# Patient Record
Sex: Female | Born: 1975 | Race: White | Hispanic: No | Marital: Married | Smoking: Never smoker
Health system: Southern US, Community
[De-identification: ages and names within clinical notes are randomized; demographics above are authoritative.]

## PROBLEM LIST (undated history)

## (undated) DIAGNOSIS — I10 Essential (primary) hypertension: Secondary | ICD-10-CM

## (undated) HISTORY — PX: NO PAST SURGERIES: SHX2092

---

## 2010-05-27 ENCOUNTER — Inpatient Hospital Stay (HOSPITAL_COMMUNITY): Admission: AD | Admit: 2010-05-27 | Discharge: 2010-05-29 | Payer: Self-pay | Admitting: Obstetrics and Gynecology

## 2010-10-25 LAB — CBC
Hemoglobin: 10.4 g/dL — ABNORMAL LOW (ref 12.0–15.0)
MCH: 32.4 pg (ref 26.0–34.0)
MCH: 32.9 pg (ref 26.0–34.0)
MCHC: 34.5 g/dL (ref 30.0–36.0)
MCHC: 35.2 g/dL (ref 30.0–36.0)
MCV: 94.6 fL (ref 78.0–100.0)
Platelets: 139 10*3/uL — ABNORMAL LOW (ref 150–400)
RBC: 3.15 MIL/uL — ABNORMAL LOW (ref 3.87–5.11)
RDW: 13.1 % (ref 11.5–15.5)
WBC: 10.2 10*3/uL (ref 4.0–10.5)

## 2010-10-25 LAB — RPR: RPR Ser Ql: NONREACTIVE

## 2010-10-25 LAB — RH IMMUNE GLOB WKUP(>/=20WKS)(NOT WOMEN'S HOSP): Fetal Screen: NEGATIVE

## 2013-11-18 ENCOUNTER — Other Ambulatory Visit: Payer: Self-pay | Admitting: Obstetrics and Gynecology

## 2013-11-18 DIAGNOSIS — N6314 Unspecified lump in the right breast, lower inner quadrant: Secondary | ICD-10-CM

## 2013-11-26 ENCOUNTER — Other Ambulatory Visit: Payer: Self-pay

## 2013-12-09 ENCOUNTER — Ambulatory Visit
Admission: RE | Admit: 2013-12-09 | Discharge: 2013-12-09 | Disposition: A | Discharge status: Home / Self Care | Payer: BC Managed Care – PPO | Source: Ambulatory Visit | Attending: Obstetrics and Gynecology | Admitting: Obstetrics and Gynecology

## 2013-12-09 DIAGNOSIS — N6314 Unspecified lump in the right breast, lower inner quadrant: Secondary | ICD-10-CM

## 2014-01-26 LAB — OB RESULTS CONSOLE HIV ANTIBODY (ROUTINE TESTING): HIV: NONREACTIVE

## 2014-01-26 LAB — OB RESULTS CONSOLE GC/CHLAMYDIA
CHLAMYDIA, DNA PROBE: NEGATIVE
Gonorrhea: NEGATIVE

## 2014-01-26 LAB — OB RESULTS CONSOLE ANTIBODY SCREEN: ANTIBODY SCREEN: NEGATIVE

## 2014-01-26 LAB — OB RESULTS CONSOLE ABO/RH: RH TYPE: NEGATIVE

## 2014-01-26 LAB — OB RESULTS CONSOLE RUBELLA ANTIBODY, IGM: Rubella: IMMUNE

## 2014-01-26 LAB — OB RESULTS CONSOLE RPR: RPR: NONREACTIVE

## 2014-01-26 LAB — OB RESULTS CONSOLE HEPATITIS B SURFACE ANTIGEN: Hepatitis B Surface Ag: NEGATIVE

## 2014-08-12 ENCOUNTER — Inpatient Hospital Stay (HOSPITAL_COMMUNITY)
Admission: AD | Admit: 2014-08-12 | Discharge: 2014-08-12 | Disposition: A | Discharge status: Home / Self Care | Payer: BC Managed Care – PPO | Source: Ambulatory Visit | Attending: Obstetrics and Gynecology | Admitting: Obstetrics and Gynecology

## 2014-08-12 ENCOUNTER — Encounter (HOSPITAL_COMMUNITY): Payer: Self-pay | Admitting: *Deleted

## 2014-08-12 DIAGNOSIS — O133 Gestational [pregnancy-induced] hypertension without significant proteinuria, third trimester: Secondary | ICD-10-CM | POA: Diagnosis not present

## 2014-08-12 DIAGNOSIS — Z3A37 37 weeks gestation of pregnancy: Secondary | ICD-10-CM | POA: Diagnosis not present

## 2014-08-12 DIAGNOSIS — R030 Elevated blood-pressure reading, without diagnosis of hypertension: Secondary | ICD-10-CM | POA: Diagnosis present

## 2014-08-12 LAB — URINE MICROSCOPIC-ADD ON

## 2014-08-12 LAB — URINALYSIS, ROUTINE W REFLEX MICROSCOPIC
Bilirubin Urine: NEGATIVE
GLUCOSE, UA: NEGATIVE mg/dL
Ketones, ur: NEGATIVE mg/dL
Leukocytes, UA: NEGATIVE
Nitrite: NEGATIVE
PH: 6.5 (ref 5.0–8.0)
Protein, ur: NEGATIVE mg/dL
Urobilinogen, UA: 0.2 mg/dL (ref 0.0–1.0)

## 2014-08-12 LAB — CBC
HCT: 33.5 % — ABNORMAL LOW (ref 36.0–46.0)
Hemoglobin: 12 g/dL (ref 12.0–15.0)
MCH: 32.5 pg (ref 26.0–34.0)
MCHC: 35.8 g/dL (ref 30.0–36.0)
MCV: 90.8 fL (ref 78.0–100.0)
PLATELETS: 121 10*3/uL — AB (ref 150–400)
RBC: 3.69 MIL/uL — AB (ref 3.87–5.11)
RDW: 13.4 % (ref 11.5–15.5)
WBC: 9.8 10*3/uL (ref 4.0–10.5)

## 2014-08-12 LAB — COMPREHENSIVE METABOLIC PANEL
ALT: 17 U/L (ref 0–35)
AST: 29 U/L (ref 0–37)
Albumin: 3.1 g/dL — ABNORMAL LOW (ref 3.5–5.2)
Alkaline Phosphatase: 120 U/L — ABNORMAL HIGH (ref 39–117)
Anion gap: 6 (ref 5–15)
BUN: 13 mg/dL (ref 6–23)
CALCIUM: 9.8 mg/dL (ref 8.4–10.5)
CO2: 27 mmol/L (ref 19–32)
Chloride: 105 mEq/L (ref 96–112)
Creatinine, Ser: 0.88 mg/dL (ref 0.50–1.10)
GFR calc Af Amer: >90 mL/min (ref >90)
GFR calc non Af Amer: 84 mL/min — ABNORMAL LOW (ref >90)
Glucose, Bld: 101 mg/dL — ABNORMAL HIGH (ref 70–99)
POTASSIUM: 4.2 mmol/L (ref 3.5–5.1)
SODIUM: 138 mmol/L (ref 135–145)
TOTAL PROTEIN: 5.7 g/dL — AB (ref 6.0–8.3)
Total Bilirubin: 0.5 mg/dL (ref 0.3–1.2)

## 2014-08-12 LAB — PROTEIN / CREATININE RATIO, URINE: CREATININE, URINE: 43 mg/dL

## 2014-08-12 NOTE — Discharge Instructions (Signed)

## 2014-08-12 NOTE — MAU Note (Signed)
Sent from OB's office for PIH eval;  

## 2014-08-12 NOTE — MAU Provider Note (Signed)
History     CSN: [id number]  Arrival date and time: 08/12/14 1534   First Provider Initiated Contact with Patient 08/12/14 1707      Chief Complaint  Patient presents with  . Hypertension   HPI  Latasha Robertson is a 38 y.o. G2P1000 at 4372w0d who was sent over from the office with elevated blood pressure. She denies any HA, visual disturbance, RUQ pain or edema. She states that the fetus has been active, and she denies any contractions, VB or LOF.   Past Medical History  Diagnosis Date  . Medical history non-contributory     Past Surgical History  Procedure Laterality Date  . No past surgeries      Family History  Problem Relation Age of Onset  . Alcohol abuse Neg Hx   . Asthma Neg Hx   . Arthritis Neg Hx   . Birth defects Neg Hx   . Cancer Neg Hx   . COPD Neg Hx   . Depression Neg Hx   . Diabetes Neg Hx   . Drug abuse Neg Hx   . Early death Neg Hx   . Hearing loss Neg Hx   . Heart disease Neg Hx   . Hyperlipidemia Neg Hx   . Hypertension Neg Hx   . Kidney disease Neg Hx   . Learning disabilities Neg Hx   . Mental illness Neg Hx   . Mental retardation Neg Hx   . Miscarriages / Stillbirths Neg Hx   . Stroke Neg Hx   . Vision loss Neg Hx   . Varicose Veins Neg Hx     History  Substance Use Topics  . Smoking status: Never Smoker   . Smokeless tobacco: Not on file  . Alcohol Use: No    Allergies:  Allergies  Allergen Reactions  . Tramadol Nausea And Vomiting  . Vicodin [Hydrocodone-Acetaminophen] Nausea And Vomiting    Prescriptions prior to admission  Medication Sig Dispense Refill Last Dose  . Prenatal Vit-Fe Fumarate-FA (PRENATAL MULTIVITAMIN) TABS tablet Take 1 tablet by mouth daily at 12 noon.   08/12/2014 at Unknown time    ROS Physical Exam   Blood pressure 127/86, pulse 77, temperature 99.3 F (37.4 C), temperature source Oral, resp. rate 18, height 5\' 4"  (1.626 m), weight 73.936 kg (163 lb).  Physical Exam  Nursing note and  vitals reviewed. Constitutional: She is oriented to person, place, and time. She appears well-developed and well-nourished. No distress.  Cardiovascular: Normal rate.   Respiratory: Effort normal.  GI: Soft. There is no tenderness.  Musculoskeletal: She exhibits no edema.  Neurological: She is alert and oriented to person, place, and time. She has normal reflexes.  No clonus   Skin: Skin is dry.  Psychiatric: She has a normal mood and affect.    MAU Course  Procedures  Results for orders placed or performed during the hospital encounter of 08/12/14 (from the past 24 hour(s))  CBC     Status: Abnormal   Collection Time: 08/12/14  4:00 PM  Result Value Ref Range   WBC 9.8 4.0 - 10.5 K/uL   RBC 3.69 (L) 3.87 - 5.11 MIL/uL   Hemoglobin 12.0 12.0 - 15.0 g/dL   HCT 78.233.5 (L) 95.636.0 - 21.346.0 %   MCV 90.8 78.0 - 100.0 fL   MCH 32.5 26.0 - 34.0 pg   MCHC 35.8 30.0 - 36.0 g/dL   RDW 08.613.4 57.811.5 - 46.915.5 %   Platelets 121 (L) 150 - 400  K/uL  Comprehensive metabolic panel     Status: Abnormal   Collection Time: 08/12/14  4:00 PM  Result Value Ref Range   Sodium 138 135 - 145 mmol/L   Potassium 4.2 3.5 - 5.1 mmol/L   Chloride 105 96 - 112 mEq/L   CO2 27 19 - 32 mmol/L   Glucose, Bld 101 (H) 70 - 99 mg/dL   BUN 13 6 - 23 mg/dL   Creatinine, Ser 1.610.88 0.50 - 1.10 mg/dL   Calcium 9.8 8.4 - 09.610.5 mg/dL   Total Protein 5.7 (L) 6.0 - 8.3 g/dL   Albumin 3.1 (L) 3.5 - 5.2 g/dL   AST 29 0 - 37 U/L   ALT 17 0 - 35 U/L   Alkaline Phosphatase 120 (H) 39 - 117 U/L   Total Bilirubin 0.5 0.3 - 1.2 mg/dL   GFR calc non Af Amer 84 (L) >90 mL/min   GFR calc Af Amer >90 >90 mL/min   Anion gap 6 5 - 15  Urinalysis, Routine w reflex microscopic     Status: Abnormal   Collection Time: 08/12/14  4:17 PM  Result Value Ref Range   Color, Urine STRAW (A) YELLOW   APPearance CLEAR CLEAR   Specific Gravity, Urine <1.005 (L) 1.005 - 1.030   pH 6.5 5.0 - 8.0   Glucose, UA NEGATIVE NEGATIVE mg/dL   Hgb urine  dipstick MODERATE (A) NEGATIVE   Bilirubin Urine NEGATIVE NEGATIVE   Ketones, ur NEGATIVE NEGATIVE mg/dL   Protein, ur NEGATIVE NEGATIVE mg/dL   Urobilinogen, UA 0.2 0.0 - 1.0 mg/dL   Nitrite NEGATIVE NEGATIVE   Leukocytes, UA NEGATIVE NEGATIVE  Protein / creatinine ratio, urine     Status: None   Collection Time: 08/12/14  4:17 PM  Result Value Ref Range   Creatinine, Urine 43.00 mg/dL   Total Protein, Urine <6 mg/dL   Protein Creatinine Ratio        0.00 - 0.15  Urine microscopic-add on     Status: Abnormal   Collection Time: 08/12/14  4:17 PM  Result Value Ref Range   Squamous Epithelial / LPF FEW (A) RARE   WBC, UA 0-2 <3 WBC/hpf   RBC / HPF 3-6 <3 RBC/hpf   Bacteria, UA MANY (A) RARE   Urine-Other AMORPHOUS URATES/PHOSPHATES    1840: D/W Dr. Henderson Cloudomblin, will dc home FU with the office on Monday/Tuesday.  Assessment and Plan   1. Transient hypertension of pregnancy in third trimester    Pre-eclampsia warning signs  Return to MAU as needed FU with the office Monday or Tuesday  Follow-up Information    [facility] HemII,JAMES E, MD.   Specialty:  Obstetrics and Gynecology   Why:  Call for an appoinment for Monday or Tuesday    Contact information:   [address] [place] [address] [phone]        Tawnya CrookHogan, Heather Donovan 08/12/2014, 5:18 PM

## 2014-08-16 ENCOUNTER — Encounter (HOSPITAL_COMMUNITY): Payer: Self-pay | Admitting: *Deleted

## 2014-08-16 ENCOUNTER — Inpatient Hospital Stay (HOSPITAL_COMMUNITY)
Admission: AD | Admit: 2014-08-16 | Discharge: 2014-08-18 | DRG: 775 | Disposition: A | Discharge status: Home / Self Care | Payer: BLUE CROSS/BLUE SHIELD | Source: Ambulatory Visit | Attending: Obstetrics and Gynecology | Admitting: Obstetrics and Gynecology

## 2014-08-16 DIAGNOSIS — O133 Gestational [pregnancy-induced] hypertension without significant proteinuria, third trimester: Principal | ICD-10-CM | POA: Diagnosis present

## 2014-08-16 DIAGNOSIS — O09523 Supervision of elderly multigravida, third trimester: Secondary | ICD-10-CM | POA: Diagnosis not present

## 2014-08-16 DIAGNOSIS — O360930 Maternal care for other rhesus isoimmunization, third trimester, not applicable or unspecified: Secondary | ICD-10-CM | POA: Diagnosis present

## 2014-08-16 DIAGNOSIS — Z3A37 37 weeks gestation of pregnancy: Secondary | ICD-10-CM | POA: Diagnosis present

## 2014-08-16 DIAGNOSIS — O99824 Streptococcus B carrier state complicating childbirth: Secondary | ICD-10-CM | POA: Diagnosis present

## 2014-08-16 DIAGNOSIS — O139 Gestational [pregnancy-induced] hypertension without significant proteinuria, unspecified trimester: Secondary | ICD-10-CM | POA: Diagnosis present

## 2014-08-16 HISTORY — DX: Essential (primary) hypertension: I10

## 2014-08-16 LAB — COMPREHENSIVE METABOLIC PANEL
ALBUMIN: 3.3 g/dL — AB (ref 3.5–5.2)
ALK PHOS: 127 U/L — AB (ref 39–117)
ALT: 18 U/L (ref 0–35)
AST: 37 U/L (ref 0–37)
Anion gap: 10 (ref 5–15)
BUN: 15 mg/dL (ref 6–23)
CO2: 23 mmol/L (ref 19–32)
Calcium: 9.1 mg/dL (ref 8.4–10.5)
Chloride: 106 mEq/L (ref 96–112)
Creatinine, Ser: 0.95 mg/dL (ref 0.50–1.10)
GFR calc Af Amer: 88 mL/min — ABNORMAL LOW (ref >90)
GFR, EST NON AFRICAN AMERICAN: 76 mL/min — AB (ref >90)
GLUCOSE: 72 mg/dL (ref 70–99)
POTASSIUM: 3.9 mmol/L (ref 3.5–5.1)
Sodium: 139 mmol/L (ref 135–145)
Total Bilirubin: 0.8 mg/dL (ref 0.3–1.2)
Total Protein: 5.8 g/dL — ABNORMAL LOW (ref 6.0–8.3)

## 2014-08-16 LAB — CBC
HEMATOCRIT: 34.9 % — AB (ref 36.0–46.0)
HEMOGLOBIN: 12.3 g/dL (ref 12.0–15.0)
MCH: 32.2 pg (ref 26.0–34.0)
MCHC: 35.2 g/dL (ref 30.0–36.0)
MCV: 91.4 fL (ref 78.0–100.0)
Platelets: 140 10*3/uL — ABNORMAL LOW (ref 150–400)
RBC: 3.82 MIL/uL — AB (ref 3.87–5.11)
RDW: 13.6 % (ref 11.5–15.5)
WBC: 9.1 10*3/uL (ref 4.0–10.5)

## 2014-08-16 LAB — LACTATE DEHYDROGENASE: LDH: 212 U/L (ref 94–250)

## 2014-08-16 LAB — URIC ACID: Uric Acid, Serum: 5.3 mg/dL (ref 2.4–7.0)

## 2014-08-16 LAB — OB RESULTS CONSOLE GBS: GBS: POSITIVE

## 2014-08-16 MED ORDER — OXYTOCIN 40 UNITS IN LACTATED RINGERS INFUSION - SIMPLE MED: 62.5000 mL/h | INTRAVENOUS | Status: DC

## 2014-08-16 MED ORDER — OXYTOCIN BOLUS FROM INFUSION: 500.0000 mL | INTRAVENOUS | Status: DC

## 2014-08-16 MED ORDER — PENICILLIN G POTASSIUM 5000000 UNITS IJ SOLR
2.5000 10*6.[IU] | INTRAVENOUS | Status: DC
  Filled 2014-08-16 (×2): qty 2.5

## 2014-08-16 MED ORDER — MISOPROSTOL 25 MCG QUARTER TABLET
25.0000 ug | ORAL_TABLET | ORAL | Status: DC | PRN
  Administered 2014-08-16 – 2014-08-17 (×2): 25 ug via VAGINAL
  Filled 2014-08-16 (×2): qty 0.25
  Filled 2014-08-16: qty 1

## 2014-08-16 MED ORDER — LIDOCAINE HCL (PF) 1 % IJ SOLN
30.0000 mL | INTRAMUSCULAR | Status: DC | PRN
  Administered 2014-08-17: 30 mL via SUBCUTANEOUS
  Filled 2014-08-16: qty 30

## 2014-08-16 MED ORDER — ONDANSETRON HCL 4 MG/2ML IJ SOLN: 4.0000 mg | Freq: Four times a day (QID) | INTRAMUSCULAR | Status: DC | PRN

## 2014-08-16 MED ORDER — CITRIC ACID-SODIUM CITRATE 334-500 MG/5ML PO SOLN: 30.0000 mL | ORAL | Status: DC | PRN

## 2014-08-16 MED ORDER — LACTATED RINGERS IV SOLN: 500.0000 mL | INTRAVENOUS | Status: DC | PRN

## 2014-08-16 MED ORDER — TERBUTALINE SULFATE 1 MG/ML IJ SOLN: 0.2500 mg | Freq: Once | INTRAMUSCULAR | Status: AC | PRN

## 2014-08-16 MED ORDER — PENICILLIN G POTASSIUM 5000000 UNITS IJ SOLR
2.5000 10*6.[IU] | INTRAVENOUS | Status: DC
  Administered 2014-08-17: 2.5 10*6.[IU] via INTRAVENOUS
  Filled 2014-08-16 (×5): qty 2.5

## 2014-08-16 MED ORDER — PENICILLIN G POTASSIUM 5000000 UNITS IJ SOLR
5.0000 10*6.[IU] | Freq: Once | INTRAVENOUS | Status: AC
  Administered 2014-08-17: 5 10*6.[IU] via INTRAVENOUS
  Filled 2014-08-16: qty 5

## 2014-08-16 MED ORDER — FLEET ENEMA 7-19 GM/118ML RE ENEM: 1.0000 | ENEMA | RECTAL | Status: DC | PRN

## 2014-08-16 MED ORDER — ACETAMINOPHEN 325 MG PO TABS: 650.0000 mg | ORAL_TABLET | ORAL | Status: DC | PRN

## 2014-08-16 MED ORDER — ZOLPIDEM TARTRATE 5 MG PO TABS: 5.0000 mg | ORAL_TABLET | Freq: Every evening | ORAL | Status: DC | PRN

## 2014-08-16 MED ORDER — LACTATED RINGERS IV SOLN
INTRAVENOUS | Status: DC
  Administered 2014-08-16 – 2014-08-17 (×2): via INTRAVENOUS

## 2014-08-16 MED ORDER — PENICILLIN G POTASSIUM 5000000 UNITS IJ SOLR
5.0000 10*6.[IU] | Freq: Once | INTRAVENOUS | Status: DC
  Filled 2014-08-16: qty 5

## 2014-08-16 NOTE — Anesthesia Preprocedure Evaluation (Addendum)
Anesthesia Evaluation  Patient identified by MRN, date of birth, ID band Patient awake    Reviewed: Allergy & Precautions, H&P , NPO status , Patient's Chart, lab work & pertinent test results  History of Anesthesia Complications Negative for: history of anesthetic complications  Airway Mallampati: II  TM Distance: >3 FB Neck ROM: Full    Dental no notable dental hx. (+) Dental Advisory Given   Pulmonary neg pulmonary ROS,  breath sounds clear to auscultation  Pulmonary exam normal       Cardiovascular hypertension (admitted for IOL for gestational HTN), Rhythm:Regular Rate:Normal     Neuro/Psych negative neurological ROS  negative psych ROS   GI/Hepatic negative GI ROS, Neg liver ROS,   Endo/Other  negative endocrine ROS  Renal/GU negative Renal ROS  negative genitourinary   Musculoskeletal negative musculoskeletal ROS (+)   Abdominal   Peds negative pediatric ROS (+)  Hematology negative hematology ROS (+)   Anesthesia Other Findings   Significant drop in Plts; recheck ordered postpartum     Reproductive/Obstetrics (+) Pregnancy                            Anesthesia Physical Anesthesia Plan  ASA: II  Anesthesia Plan: Epidural   Post-op Pain Management:    Induction:   Airway Management Planned:   Additional Equipment:   Intra-op Plan:   Post-operative Plan:   Informed Consent: I have reviewed the patients History and Physical, chart, labs and discussed the procedure including the risks, benefits and alternatives for the proposed anesthesia with the patient or authorized representative who has indicated his/her understanding and acceptance.   Dental Advisory Given  Plan Discussed with:   Anesthesia Plan Comments: (Labs checked- platelets confirmed with RN in room. Fetal heart tracing, per RN, reported to be stable enough for sitting procedure. Discussed epidural, and  patient consents to the procedure:  included risk of possible headache,backache, failed block, allergic reaction, and nerve injury. This patient was asked if she had any questions or concerns before the procedure started.)       Anesthesia Quick Evaluation

## 2014-08-16 NOTE — H&P (Signed)
Latasha Robertson is a 39 y.o. female presenting for induction of labor secondary to gestational hypertension.  The patient has been followed in the office over the past week for elevated BPs (mild range).  She denies HA, CP/SOB, RUQ pain, or vision change.  Antepartum course has been complicated by Rh neg, AMA with negative first tri screen, and back pain well controlled without medication.  GBS positive.  Maternal Medical History:  Fetal activity: Perceived fetal activity is normal.   Last perceived fetal movement was within the past hour.    Prenatal complications: PIH.   Prenatal Complications - Diabetes: none.    OB History    Gravida Para Term Preterm AB TAB SAB Ectopic Multiple Living   Past Medical History  Diagnosis Date  . Medical history non-contributory    Past Surgical History  Procedure Laterality Date  . No past surgeries     Family History: family history is negative for Alcohol abuse, Asthma, Arthritis, Birth defects, Cancer, COPD, Depression, Diabetes, Drug abuse, Early death, Hearing loss, Heart disease, Hyperlipidemia, Hypertension, Kidney disease, Learning disabilities, Mental illness, Mental retardation, Miscarriages / Stillbirths, Stroke, Vision loss, and Varicose Veins. Social History:  reports that she has never smoked. She does not have any smokeless tobacco history on file. She reports that she does not drink alcohol or use illicit drugs.   Prenatal Transfer Tool  Maternal Diabetes: No Genetic Screening: Normal Maternal Ultrasounds/Referrals: Normal Fetal Ultrasounds or other Referrals:  None Maternal Substance Abuse:  No Significant Maternal Medications:  None Significant Maternal Lab Results:  Lab values include: Group B Strep positive Other Comments:  None  ROS    There were no vitals taken for this visit. Maternal Exam:  Uterine Assessment: Contraction strength is mild.  Contraction frequency is rare.   Abdomen: Patient  reports no abdominal tenderness. Fundal height is c/w dates.   Estimated fetal weight is 6#8.   Fetal presentation: vertex     Physical Exam  Constitutional: She is oriented to person, place, and time. She appears well-developed and well-nourished.  GI: Soft. There is no rebound and no guarding.  Neurological: She is alert and oriented to person, place, and time.  Skin: Skin is warm and dry.  Psychiatric: She has a normal mood and affect. Her behavior is normal.    Prenatal labs: ABO, Rh: B/Negative/-- (06/16 0000) Antibody: Negative (06/16 0000) Rubella: Immune (06/16 0000) RPR: Nonreactive (06/16 0000)  HBsAg: Negative (06/16 0000)  HIV: Non-reactive (06/16 0000)  GBS:     Assessment/Plan: 38yo G2P1001 at [redacted]w[redacted]d with GHTN -IOL with VMP -Epidural when desired -PCN for GBS ppx   [name] 08/16/2014, 7:53 PM

## 2014-08-17 ENCOUNTER — Inpatient Hospital Stay (HOSPITAL_COMMUNITY): Payer: BLUE CROSS/BLUE SHIELD | Admitting: Anesthesiology

## 2014-08-17 ENCOUNTER — Encounter (HOSPITAL_COMMUNITY): Payer: Self-pay | Admitting: *Deleted

## 2014-08-17 LAB — CBC
HCT: 34.8 % — ABNORMAL LOW (ref 36.0–46.0)
HCT: 32.8 % — ABNORMAL LOW (ref 36.0–46.0)
HEMOGLOBIN: 12.3 g/dL (ref 12.0–15.0)
Hemoglobin: 11.9 g/dL — ABNORMAL LOW (ref 12.0–15.0)
MCH: 32.1 pg (ref 26.0–34.0)
MCH: 32.9 pg (ref 26.0–34.0)
MCHC: 35.3 g/dL (ref 30.0–36.0)
MCHC: 36.3 g/dL — ABNORMAL HIGH (ref 30.0–36.0)
MCV: 90.9 fL (ref 78.0–100.0)
MCV: 90.6 fL (ref 78.0–100.0)
PLATELETS: 110 10*3/uL — AB (ref 150–400)
PLATELETS: 120 10*3/uL — AB (ref 150–400)
RBC: 3.83 MIL/uL — ABNORMAL LOW (ref 3.87–5.11)
RBC: 3.62 MIL/uL — ABNORMAL LOW (ref 3.87–5.11)
RDW: 13.4 % (ref 11.5–15.5)
RDW: 13.2 % (ref 11.5–15.5)
WBC: 9.1 10*3/uL (ref 4.0–10.5)
WBC: 16.7 10*3/uL — AB (ref 4.0–10.5)

## 2014-08-17 LAB — RPR

## 2014-08-17 MED ORDER — DIPHENHYDRAMINE HCL 50 MG/ML IJ SOLN: 12.5000 mg | INTRAMUSCULAR | Status: DC | PRN

## 2014-08-17 MED ORDER — ONDANSETRON HCL 4 MG PO TABS
4.0000 mg | ORAL_TABLET | ORAL | Status: DC | PRN
Start: 2014-08-17 — End: 2014-08-18

## 2014-08-17 MED ORDER — OXYCODONE-ACETAMINOPHEN 5-325 MG PO TABS: 2.0000 | ORAL_TABLET | ORAL | Status: DC | PRN

## 2014-08-17 MED ORDER — SIMETHICONE 80 MG PO CHEW: 80.0000 mg | CHEWABLE_TABLET | ORAL | Status: DC | PRN

## 2014-08-17 MED ORDER — DIBUCAINE 1 % RE OINT: 1.0000 "application " | TOPICAL_OINTMENT | RECTAL | Status: DC | PRN

## 2014-08-17 MED ORDER — TERBUTALINE SULFATE 1 MG/ML IJ SOLN: 0.2500 mg | Freq: Once | INTRAMUSCULAR | Status: DC | PRN

## 2014-08-17 MED ORDER — TETANUS-DIPHTH-ACELL PERTUSSIS 5-2.5-18.5 LF-MCG/0.5 IM SUSP: 0.5000 mL | Freq: Once | INTRAMUSCULAR | Status: DC

## 2014-08-17 MED ORDER — PHENYLEPHRINE 40 MCG/ML (10ML) SYRINGE FOR IV PUSH (FOR BLOOD PRESSURE SUPPORT)
80.0000 ug | PREFILLED_SYRINGE | INTRAVENOUS | Status: DC | PRN
  Filled 2014-08-17: qty 10
  Filled 2014-08-17: qty 2

## 2014-08-17 MED ORDER — SENNOSIDES-DOCUSATE SODIUM 8.6-50 MG PO TABS
2.0000 | ORAL_TABLET | ORAL | Status: DC
  Administered 2014-08-18: 2 via ORAL
  Filled 2014-08-17: qty 2

## 2014-08-17 MED ORDER — MEDROXYPROGESTERONE ACETATE 150 MG/ML IM SUSP: 150.0000 mg | INTRAMUSCULAR | Status: DC | PRN

## 2014-08-17 MED ORDER — PHENYLEPHRINE 40 MCG/ML (10ML) SYRINGE FOR IV PUSH (FOR BLOOD PRESSURE SUPPORT)
80.0000 ug | PREFILLED_SYRINGE | INTRAVENOUS | Status: DC | PRN
  Administered 2014-08-17: 80 ug via INTRAVENOUS
  Filled 2014-08-17: qty 2

## 2014-08-17 MED ORDER — FENTANYL 2.5 MCG/ML BUPIVACAINE 1/10 % EPIDURAL INFUSION (WH - ANES)
14.0000 mL/h | INTRAMUSCULAR | Status: DC | PRN
  Administered 2014-08-17: 14 mL/h via EPIDURAL
  Filled 2014-08-17: qty 125

## 2014-08-17 MED ORDER — BENZOCAINE-MENTHOL 20-0.5 % EX AERO: 1.0000 "application " | INHALATION_SPRAY | CUTANEOUS | Status: DC | PRN

## 2014-08-17 MED ORDER — LANOLIN HYDROUS EX OINT: TOPICAL_OINTMENT | CUTANEOUS | Status: DC | PRN

## 2014-08-17 MED ORDER — DIPHENHYDRAMINE HCL 25 MG PO CAPS: 25.0000 mg | ORAL_CAPSULE | Freq: Four times a day (QID) | ORAL | Status: DC | PRN

## 2014-08-17 MED ORDER — LACTATED RINGERS IV SOLN: 500.0000 mL | Freq: Once | INTRAVENOUS | Status: DC

## 2014-08-17 MED ORDER — EPHEDRINE 5 MG/ML INJ
10.0000 mg | INTRAVENOUS | Status: DC | PRN
  Filled 2014-08-17: qty 2

## 2014-08-17 MED ORDER — SODIUM BICARBONATE 8.4 % IV SOLN
INTRAVENOUS | Status: DC | PRN
  Administered 2014-08-17: 5 mL via EPIDURAL

## 2014-08-17 MED ORDER — IBUPROFEN 600 MG PO TABS
600.0000 mg | ORAL_TABLET | Freq: Four times a day (QID) | ORAL | Status: DC
  Administered 2014-08-17 – 2014-08-18 (×4): 600 mg via ORAL
  Filled 2014-08-17 (×4): qty 1

## 2014-08-17 MED ORDER — WITCH HAZEL-GLYCERIN EX PADS: 1.0000 "application " | MEDICATED_PAD | CUTANEOUS | Status: DC | PRN

## 2014-08-17 MED ORDER — LIDOCAINE HCL (PF) 1 % IJ SOLN
INTRAMUSCULAR | Status: DC | PRN
  Administered 2014-08-17: 4 mL
  Administered 2014-08-17: 6 mL

## 2014-08-17 MED ORDER — ONDANSETRON HCL 4 MG/2ML IJ SOLN: 4.0000 mg | INTRAMUSCULAR | Status: DC | PRN

## 2014-08-17 MED ORDER — OXYTOCIN 40 UNITS IN LACTATED RINGERS INFUSION - SIMPLE MED
1.0000 m[IU]/min | INTRAVENOUS | Status: DC
  Administered 2014-08-17: 2 m[IU]/min via INTRAVENOUS
  Filled 2014-08-17: qty 1000

## 2014-08-17 MED ORDER — PRENATAL MULTIVITAMIN CH
1.0000 | ORAL_TABLET | Freq: Every day | ORAL | Status: DC
  Administered 2014-08-18: 1 via ORAL
  Filled 2014-08-17: qty 1

## 2014-08-17 MED ORDER — OXYCODONE-ACETAMINOPHEN 5-325 MG PO TABS: 1.0000 | ORAL_TABLET | ORAL | Status: DC | PRN

## 2014-08-17 MED ORDER — FENTANYL 2.5 MCG/ML BUPIVACAINE 1/10 % EPIDURAL INFUSION (WH - ANES): 14.0000 mL/h | INTRAMUSCULAR | Status: DC | PRN

## 2014-08-17 MED ORDER — MEASLES, MUMPS & RUBELLA VAC ~~LOC~~ INJ
0.5000 mL | INJECTION | Freq: Once | SUBCUTANEOUS | Status: DC
  Filled 2014-08-17: qty 0.5

## 2014-08-17 NOTE — Progress Notes (Signed)
Pt feels ctx occ.  No lof.  + FM Received 2 doses cytotec o/n but nothing this am  FHT reassuring, cat 1 Toco Q2-3 Cvx 2/50/-2 AROM - clear/blood tinged  A/P:  Will start pitocin Epidural prn

## 2014-08-17 NOTE — Anesthesia Procedure Notes (Signed)

## 2014-08-17 NOTE — Progress Notes (Signed)
Pt feeling more discomfort w/ ctx L<R  FHR - occasional variable decels, good BTBV between. Improved with position change Toco Q2 Cvx 9.5/C/+1  A/P  Exp mngt

## 2014-08-17 NOTE — Progress Notes (Signed)
SVD of vigorous female infant w/ apgars of 8,9.  Placenta delivered spontaneous w/ 3VC.   2nd degree lac repaired w/ 3-0 vicryl rapide.  Fundus firm.  EBL 350cc .  Mom and baby stable couplet care

## 2014-08-18 LAB — CBC
HCT: 35.6 % — ABNORMAL LOW (ref 36.0–46.0)
Hemoglobin: 12.6 g/dL (ref 12.0–15.0)
MCH: 32.6 pg (ref 26.0–34.0)
MCHC: 35.4 g/dL (ref 30.0–36.0)
MCV: 92 fL (ref 78.0–100.0)
Platelets: 124 10*3/uL — ABNORMAL LOW (ref 150–400)
RBC: 3.87 MIL/uL (ref 3.87–5.11)
RDW: 13.6 % (ref 11.5–15.5)
WBC: 13.3 10*3/uL — AB (ref 4.0–10.5)

## 2014-08-18 MED ORDER — RHO D IMMUNE GLOBULIN 1500 UNIT/2ML IJ SOSY
300.0000 ug | PREFILLED_SYRINGE | Freq: Once | INTRAMUSCULAR | Status: AC
  Administered 2014-08-18: 300 ug via INTRAVENOUS
  Filled 2014-08-18: qty 2

## 2014-08-18 MED ORDER — IBUPROFEN 600 MG PO TABS: 600.0000 mg | ORAL_TABLET | Freq: Four times a day (QID) | ORAL | Status: DC

## 2014-08-18 NOTE — Anesthesia Postprocedure Evaluation (Signed)
  Anesthesia Post-op Note  Patient: Latasha Robertson  Procedure(s) Performed: * No procedures listed *  Patient Location: Mother/Baby  Anesthesia Type:Epidural  Level of Consciousness: awake, alert , oriented and patient cooperative  Airway and Oxygen Therapy: Patient Spontanous Breathing  Post-op Pain: mild  Post-op Assessment: Post-op Vital signs reviewed, Patient's Cardiovascular Status Stable, Respiratory Function Stable, Patent Airway, No headache, No backache, No residual numbness and No residual motor weakness  Post-op Vital Signs: Reviewed and stable  Last Vitals:  Filed Vitals:   08/18/14 0530  BP: 135/82  Pulse: 68  Temp: 36.7 C  Resp: 18    Complications: No apparent anesthesia complications

## 2014-08-18 NOTE — Discharge Summary (Signed)
Obstetric Discharge Summary Reason for Admission: induction of labor Prenatal Procedures: ultrasound Intrapartum Procedures: spontaneous vaginal delivery Postpartum Procedures: none Complications-Operative and Postpartum: 1 degree perineal laceration HEMOGLOBIN  Date Value Ref Range Status  08/18/2014 12.6 12.0 - 15.0 g/dL Final   HCT  Date Value Ref Range Status  08/18/2014 35.6* 36.0 - 46.0 % Final    Physical Exam:  General: alert and cooperative Lochia: appropriate Uterine Fundus: firm Incision: healing well DVT Evaluation: No evidence of DVT seen on physical exam. Negative Homan's sign. No cords or calf tenderness. No significant calf/ankle edema.  Discharge Diagnoses: Term Pregnancy-delivered  Discharge Information: Date: 08/18/2014 Activity: pelvic rest Diet: routine Medications: PNV and Ibuprofen Condition: stable Instructions: refer to practice specific booklet Discharge to: home   Newborn Data: Live born female  Birth Weight: 6 lb 13.7 oz (3110 g) APGAR: 9, 9  Home with mother.  Chaniece Barbato G 08/18/2014, 7:21 AM

## 2014-08-18 NOTE — Progress Notes (Signed)
Post Partum Day 1 Subjective: no complaints, up ad lib, voiding, tolerating PO and desires early discharge  Objective: Blood pressure 135/82, pulse 68, temperature 98.1 F (36.7 C), temperature source Oral, resp. rate 18, height 5\' 4"  (1.626 m), weight 162 lb (73.483 kg), SpO2 97 %, unknown if currently breastfeeding.  Physical Exam:  General: alert and cooperative Lochia: appropriate Uterine Fundus: firm Incision: healing well DVT Evaluation: No evidence of DVT seen on physical exam. Negative Homan's sign. No cords or calf tenderness. No significant calf/ankle edema.   Recent Labs  08/17/14 1555 08/18/14 0647  HGB 11.9* 12.6  HCT 32.8* 35.6*    Assessment/Plan: Discharge home   LOS: 2 days   Cassi Jenne G 08/18/2014, 7:13 AM

## 2014-08-19 LAB — RH IG WORKUP (INCLUDES ABO/RH)
ABO/RH(D): B NEG
Fetal Screen: NEGATIVE
Gestational Age(Wks): 37.5
UNIT DIVISION: 0

## 2014-08-20 LAB — TYPE AND SCREEN
ABO/RH(D): B NEG
ANTIBODY SCREEN: POSITIVE
DAT, IGG: NEGATIVE
Unit division: 0
Unit division: 0

## 2014-11-10 ENCOUNTER — Other Ambulatory Visit: Payer: Self-pay | Admitting: Obstetrics and Gynecology

## 2014-11-10 DIAGNOSIS — R2232 Localized swelling, mass and lump, left upper limb: Secondary | ICD-10-CM

## 2014-11-15 ENCOUNTER — Other Ambulatory Visit: Payer: Self-pay | Admitting: Obstetrics and Gynecology

## 2014-11-15 ENCOUNTER — Ambulatory Visit
Admission: RE | Admit: 2014-11-15 | Discharge: 2014-11-15 | Disposition: A | Discharge status: Home / Self Care | Payer: BLUE CROSS/BLUE SHIELD | Source: Ambulatory Visit | Attending: Obstetrics and Gynecology | Admitting: Obstetrics and Gynecology

## 2014-11-15 DIAGNOSIS — R2232 Localized swelling, mass and lump, left upper limb: Secondary | ICD-10-CM

## 2014-11-15 DIAGNOSIS — R2231 Localized swelling, mass and lump, right upper limb: Secondary | ICD-10-CM

## 2014-12-02 ENCOUNTER — Encounter: Payer: Self-pay | Admitting: Internal Medicine

## 2019-07-22 ENCOUNTER — Other Ambulatory Visit (HOSPITAL_COMMUNITY): Payer: Self-pay | Admitting: Internal Medicine

## 2019-07-22 ENCOUNTER — Other Ambulatory Visit: Payer: Self-pay

## 2019-07-22 ENCOUNTER — Ambulatory Visit (HOSPITAL_COMMUNITY)
Admission: RE | Admit: 2019-07-22 | Discharge: 2019-07-22 | Disposition: A | Discharge status: Home / Self Care | Payer: 59 | Source: Ambulatory Visit | Attending: Internal Medicine | Admitting: Internal Medicine

## 2019-07-22 ENCOUNTER — Other Ambulatory Visit: Payer: Self-pay | Admitting: Internal Medicine

## 2019-07-22 DIAGNOSIS — M7918 Myalgia, other site: Secondary | ICD-10-CM | POA: Diagnosis not present

## 2019-07-22 DIAGNOSIS — M5430 Sciatica, unspecified side: Secondary | ICD-10-CM | POA: Diagnosis present

## 2019-07-22 MED ORDER — GADOBUTROL 1 MMOL/ML IV SOLN
7.0000 mL | Freq: Once | INTRAVENOUS | Status: AC | PRN
  Administered 2019-07-22: 7 mL via INTRAVENOUS

## 2020-05-19 IMAGING — MR MR LUMBAR SPINE WO/W CM
4 of 8 series · 19 of 48 positions shown · IV contrast (yes)
Comparison: None.

CLINICAL DATA: Severe buttock pain with right leg pain

EXAM:
MRI LUMBAR SPINE WITHOUT AND WITH CONTRAST
TECHNIQUE: Multiplanar and multiecho pulse sequences of the lumbar spine were
obtained without and with intravenous contrast.
CONTRAST:  7mL GADAVIST GADOBUTROL 1 MMOL/ML IV SOLN

[Series 3: T1 · sagittal · 4.0mm · 0.53mm/px · 4 of 13 slices shown (1 of 2)]
[im 1/13]
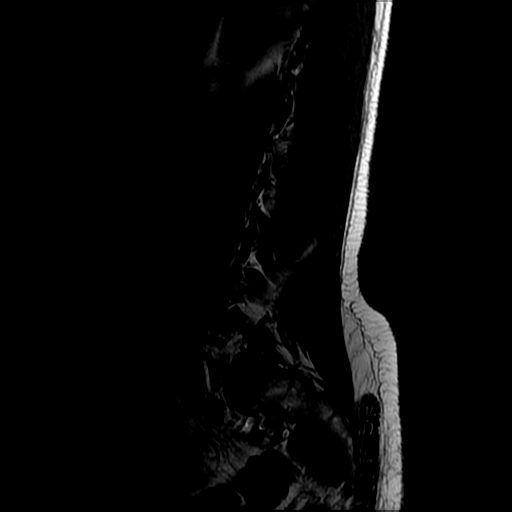
[im 5/13]
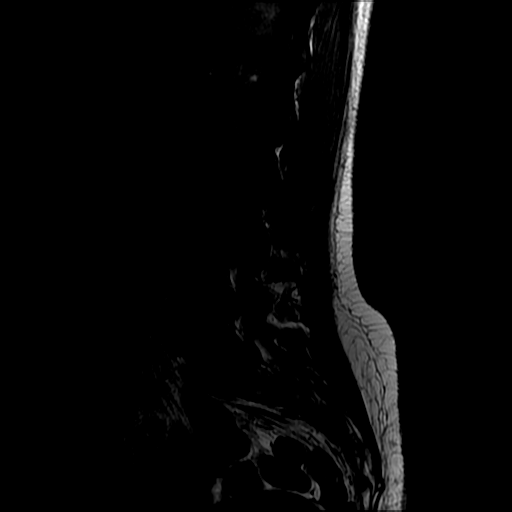
[im 9/13]
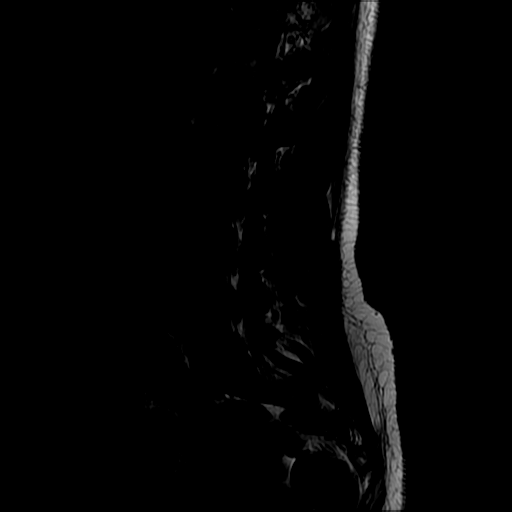
[im 13/13]
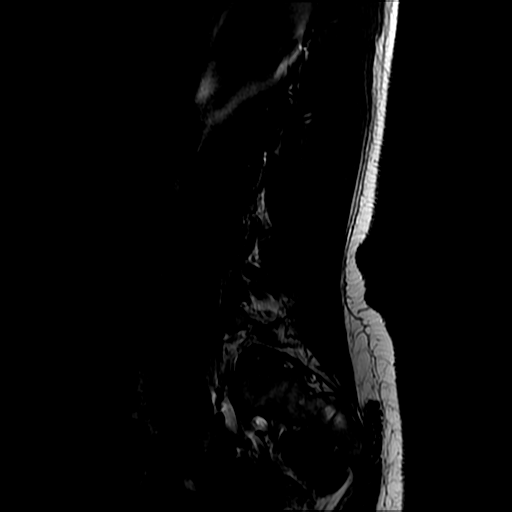

[Series 4: T2 post-contrast · sagittal · 4.0mm · 0.53mm/px · 3 of 13 slices shown]
[im 1/13]
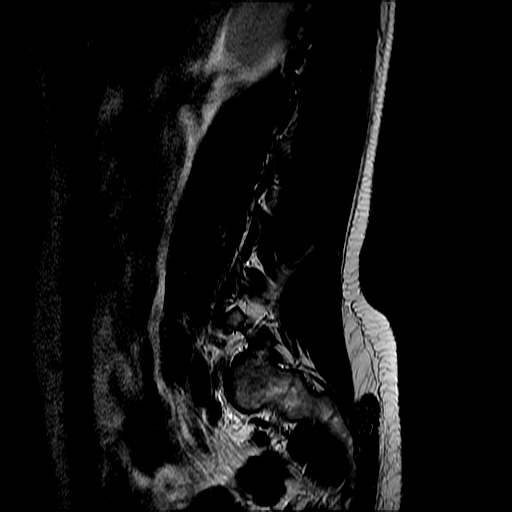
[im 7/13]
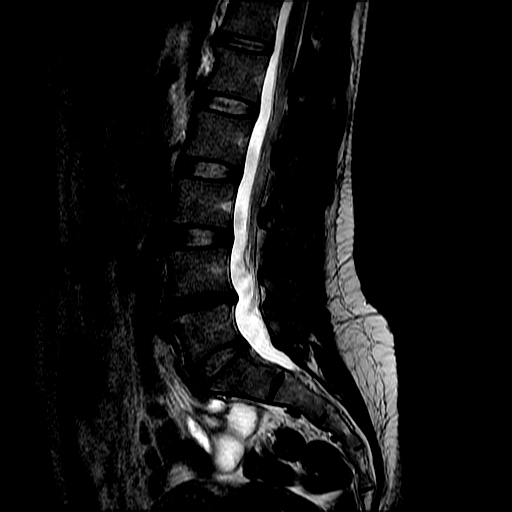
[im 13/13]
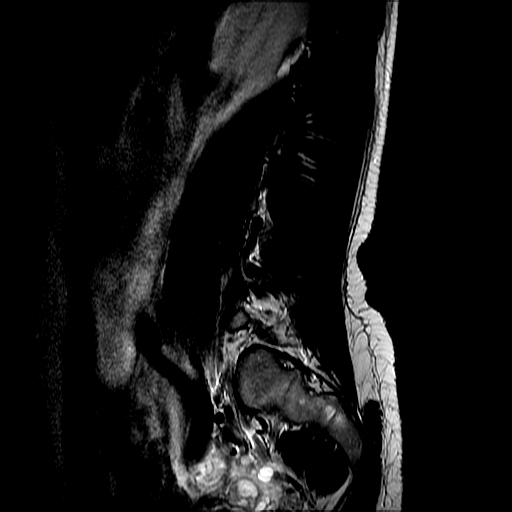

[Series 6: T2 · axial · 4.0mm · 0.39mm/px · z∈[-102,+122]mm · 8 of 41 slices shown]
[im 1/41]
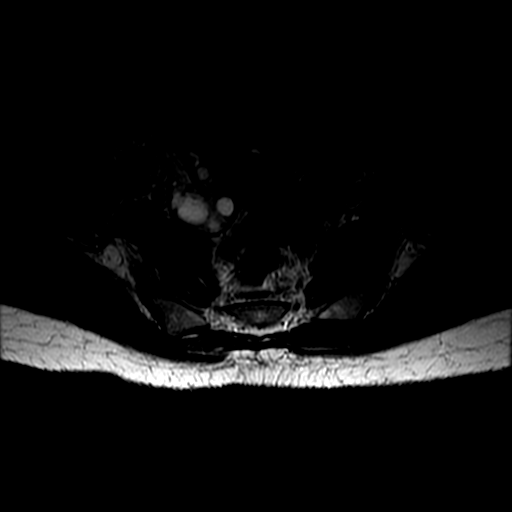
[im 5/41]
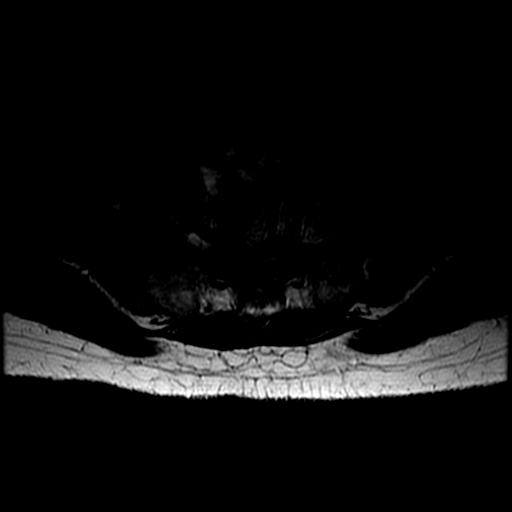
[im 14/41]
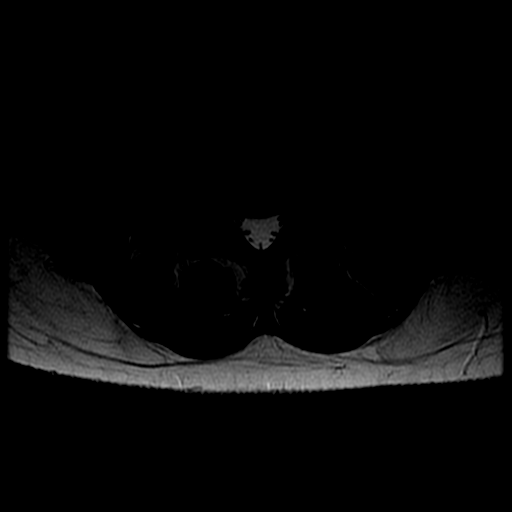
[im 18/41]
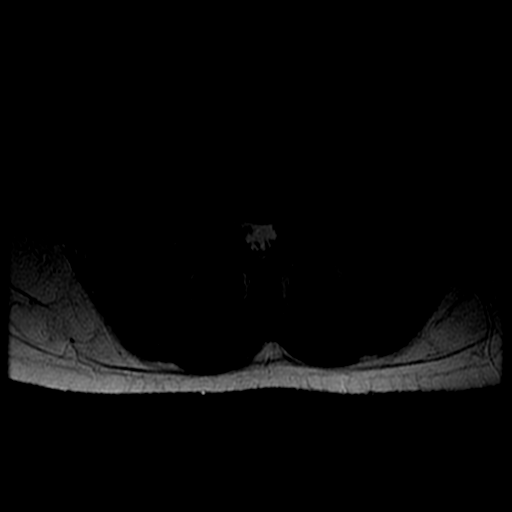
[im 23/41]
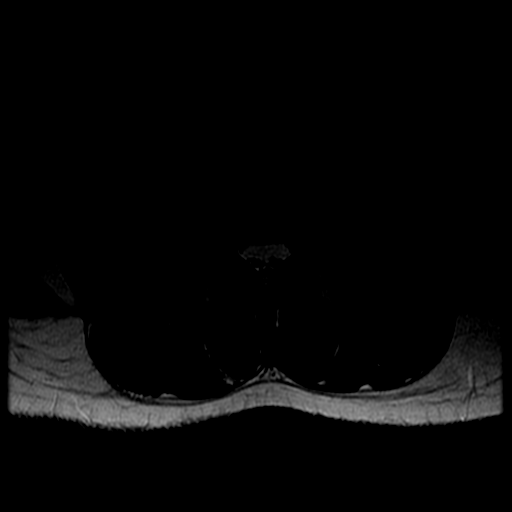
[im 27/41]
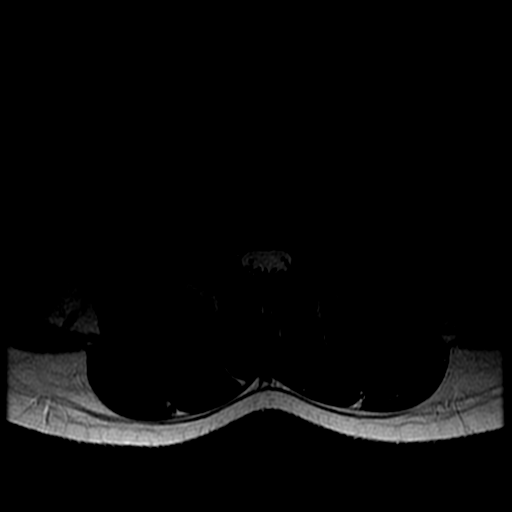
[im 36/41]
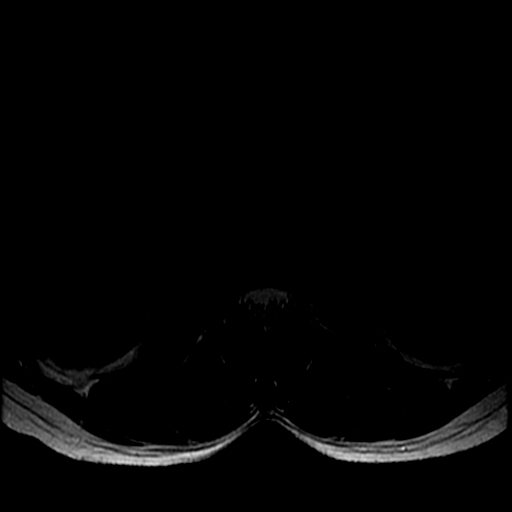
[im 41/41]
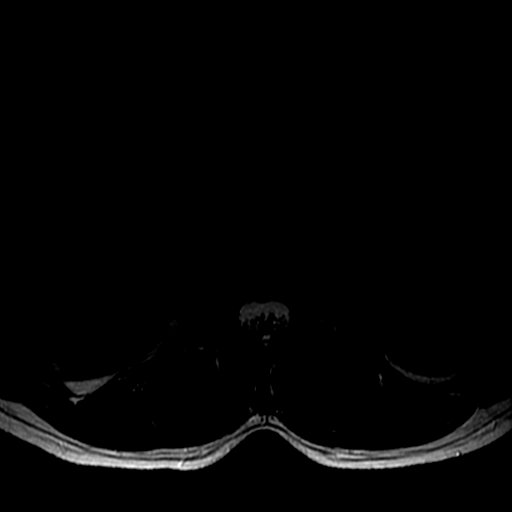

[Series 7: T1 · axial · 4.0mm · 0.39mm/px · z∈[-102,+97]mm · 4 of 41 slices shown (2 of 2)]
[im 1/41]
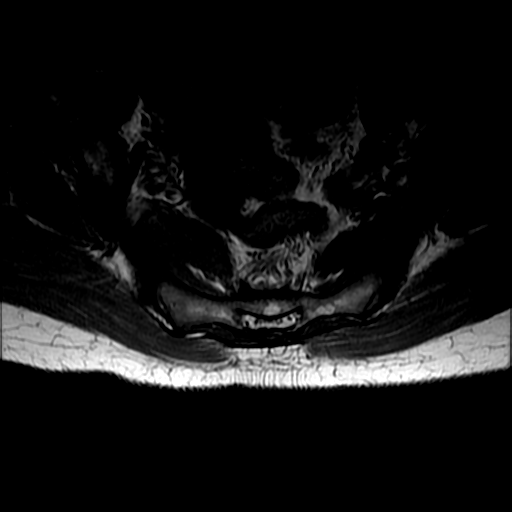
[im 5/41]
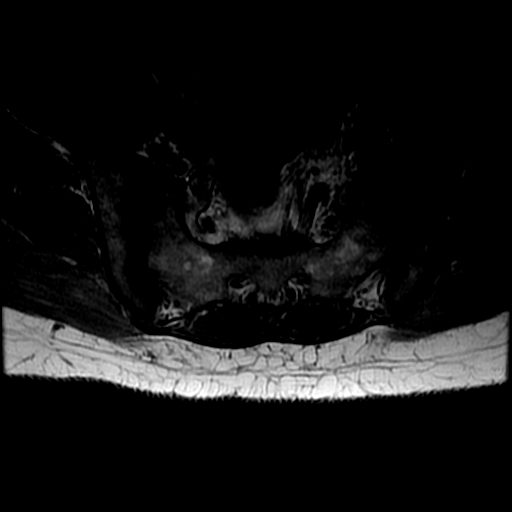
[im 23/41]
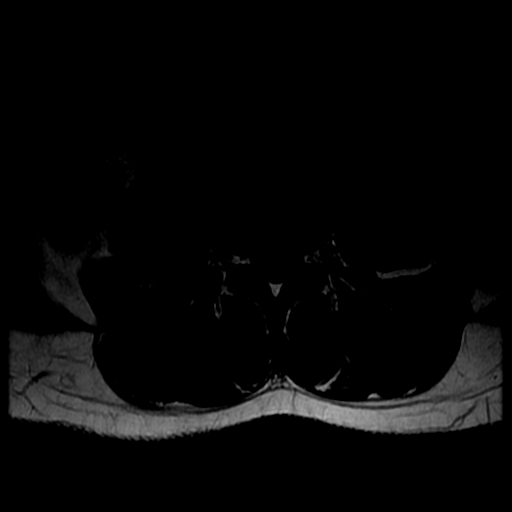
[im 36/41]
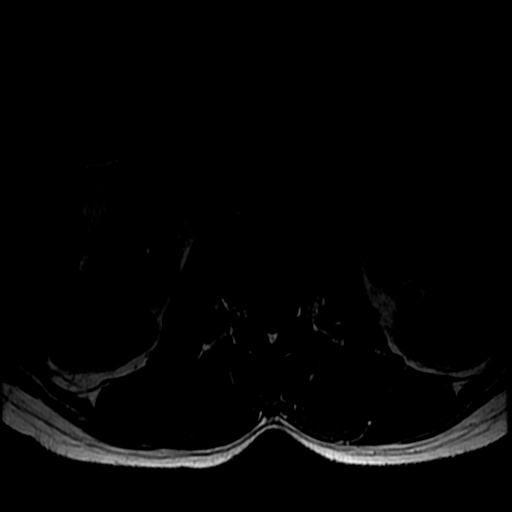

[19 of 48 positions shown; findings below may reference images not displayed]

FINDINGS: Segmentation:  Normal

Alignment:  Slight retrolisthesis L4-5 otherwise normal alignment

Vertebrae: Normal bone marrow. Negative for fracture or mass.
Negative sacrum.

Conus medullaris and cauda equina: Conus extends to the L1-2 level.
Conus and cauda equina appear normal.

Paraspinal and other soft tissues: Follicles on the right ovary.
Mild amount of free fluid in the pelvis posteriorly which is likely
physiologic. No retroperitoneal mass or adenopathy.

Disc levels:

L1-2: Negative

L2-3: Negative

L3-4: Negative

L4-5: Disc degeneration with disc space narrowing. Moderate diffuse
disc bulging and mild facet degeneration. Mild spinal stenosis and
moderate subarticular stenosis bilaterally

L5-S1: Negative
IMPRESSION: Mild spinal stenosis and moderate subarticular stenosis bilaterally
at L4-5 due to disc and facet degeneration.

## 2022-10-25 ENCOUNTER — Encounter: Payer: Self-pay | Admitting: Gastroenterology

## 2022-12-26 ENCOUNTER — Encounter: Payer: Self-pay | Admitting: Gastroenterology

## 2022-12-26 ENCOUNTER — Ambulatory Visit: Payer: PRIVATE HEALTH INSURANCE | Admitting: Gastroenterology

## 2022-12-26 VITALS — BP 116/80 | HR 88 | Ht 64.0 in | Wt 136.8 lb

## 2022-12-26 DIAGNOSIS — R198 Other specified symptoms and signs involving the digestive system and abdomen: Secondary | ICD-10-CM

## 2022-12-26 DIAGNOSIS — Z83719 Family history of colon polyps, unspecified: Secondary | ICD-10-CM | POA: Diagnosis not present

## 2022-12-26 NOTE — Progress Notes (Signed)
HPI : Latasha Robertson is a very pleasant 47 year old female with a history of hypertension who is referred to [place] by Dr. Creola Robertson for further evaluation of a vague left lower quadrant sensation.  The patient states that the sensation has been there several months now.  She has difficulty describing the sensation, but states that it is not any pain or discomfort.  She says that it, feels like something is in there.  She initially thought it was ovarian in etiology, but a subsequent evaluation by her gynecologist to include pelvic ultrasound did not reveal any abnormalities. She has not had any change in her bowel habits.  She typically has a formed stool once a day.  She denies any sustained changes in her stool caliber or stool consistency.  No blood in the stool.  No straining with bowel movements.  No urgency or tenesmus. She notices this sensation more when she is not busy or doing anything else.  Otherwise, no factors make the sensation more or less prominent. Her weight has been stable.  She states that both of her parents have had polyps on previous colonoscopies, and that her sister recently underwent a colonoscopy and had a large polyp removed.  She was recommended to repeat colonoscopy in 1 year.  Her sister is 35.   Past Medical History:  Diagnosis Date   Hypertension      Past Surgical History:  Procedure Laterality Date   NO PAST SURGERIES     Family History  Problem Relation Age of Onset   Prostate cancer Father    Alcohol abuse Neg Hx    Asthma Neg Hx    Arthritis Neg Hx    Birth defects Neg Hx    Cancer Neg Hx    COPD Neg Hx    Depression Neg Hx    Diabetes Neg Hx    Drug abuse Neg Hx    Early death Neg Hx    Hearing loss Neg Hx    Heart disease Neg Hx    Hyperlipidemia Neg Hx    Hypertension Neg Hx    Kidney disease Neg Hx    Learning disabilities Neg Hx    Mental illness Neg Hx    Mental retardation Neg Hx    Miscarriages / Stillbirths Neg Hx    Stroke  Neg Hx    Vision loss Neg Hx    Varicose Veins Neg Hx    Social History   Tobacco Use   Smoking status: Never  Substance Use Topics   Alcohol use: No   Drug use: No   No current outpatient medications on file.   No current facility-administered medications for this visit.   Allergies  Allergen Reactions   Tramadol Nausea And Vomiting   Vicodin [Hydrocodone-Acetaminophen] Nausea And Vomiting     Review of Systems: All systems reviewed and negative except where noted in HPI.    No results found.  Physical Exam: BP 116/80   Pulse 88   Ht 5\' 4"  (1.626 m)   Wt 136 lb 12.8 oz (62.1 kg)   LMP 12/12/2022   BMI 23.48 kg/m  Constitutional: Pleasant,well-developed, Caucasian female in no acute distress. HEENT: Normocephalic and atraumatic. Conjunctivae are normal. No scleral icterus. Neck supple.  Cardiovascular: Normal rate, regular rhythm.  Pulmonary/chest: Effort normal and breath sounds normal. No wheezing, rales or rhonchi. Abdominal: Soft, nondistended, nontender. Bowel sounds active throughout. There are no masses palpable. No hepatomegaly. Extremities: no edema Neurological: Alert and  oriented to person place and time. Skin: Skin is warm and dry. No rashes noted. Psychiatric: Normal mood and affect. Behavior is normal.  CBC    Component Value Date/Time   WBC 13.3 (H) 08/18/2014 0647   RBC 3.87 08/18/2014 0647   HGB 12.6 08/18/2014 0647   HCT 35.6 (L) 08/18/2014 0647   PLT 124 (L) 08/18/2014 0647   MCV 92.0 08/18/2014 0647   MCH 32.6 08/18/2014 0647   MCHC 35.4 08/18/2014 0647   RDW 13.6 08/18/2014 0647    CMP     Component Value Date/Time   NA 139 08/16/2014 2030   K 3.9 08/16/2014 2030   CL 106 08/16/2014 2030   CO2 23 08/16/2014 2030   GLUCOSE 72 08/16/2014 2030   BUN 15 08/16/2014 2030   CREATININE 0.95 08/16/2014 2030   CALCIUM 9.1 08/16/2014 2030   PROT 5.8 (L) 08/16/2014 2030   ALBUMIN 3.3 (L) 08/16/2014 2030   AST 37 08/16/2014 2030    ALT 18 08/16/2014 2030   ALKPHOS 127 (H) 08/16/2014 2030   BILITOT 0.8 08/16/2014 2030   GFRNONAA 76 (L) 08/16/2014 2030   GFRAA 88 (L) 08/16/2014 2030     ASSESSMENT AND PLAN: 47 year old female with several month history of vague left lower quadrant/pelvic sensory abnormality, without pain, without tenderness to palpation and without any changes in bowel habits.  Pelvic ultrasound reportedly normal.  No red flag symptoms.  Unclear exactly what is causing this patient's unusual symptom, but there is nothing concerning about it.  The patient does have a sister with what sounds like an advanced polyp at age 26, so it would be reasonable to proceed with colon cancer screening for the patient.  Although the likelihood of finding an intraluminal etiology to explain the patient's symptoms is very low, this can be excluded at the same time as her screening colonoscopy.  Family history of advanced polyps - Colonoscopy  Left lower quadrant sensation -Provided reassurance, colonoscopy will exclude mass lesion -If symptom persists following colonoscopy, can consider CT scan  The details, risks (including bleeding, perforation, infection, missed lesions, medication reactions and possible hospitalization or surgery if complications occur), benefits, and alternatives to colonoscopy with possible biopsy and possible polypectomy were discussed with the patient and she consents to proceed.   Latasha Chavarria E. Tomasa Rand, MD [facility]  CC:  Latasha Corn, MD

## 2022-12-26 NOTE — Patient Instructions (Signed)
_______________________________________________________  If your blood pressure at your visit was 140/90 or greater, please contact your primary care physician to follow up on this.  _______________________________________________________  If you are age 45 or older, your body mass index should be between 23-30. Your Body mass index is 23.48 kg/m. If this is out of the aforementioned range listed, please consider follow up with your Primary Care Provider.  If you are age 34 or younger, your body mass index should be between 19-25. Your Body mass index is 23.48 kg/m. If this is out of the aformentioned range listed, please consider follow up with your Primary Care Provider.   You have been scheduled for a colonoscopy. Please follow written instructions given to you at your visit today.  Please pick up your prep supplies at the pharmacy within the next 1-3 days. If you use inhalers (even only as needed), please bring them with you on the day of your procedure.  ________________________________________________________  The [facility] providers would like to encourage you to use [facility] to communicate with providers for non-urgent requests or questions.  Due to long hold times on the telephone, sending your provider a message by [facility] may be a faster and more efficient way to get a response.  Please allow 48 business hours for a response.  Please remember that this is for non-urgent requests.   It was a pleasure to see you today!  Thank you for trusting me with your gastrointestinal care!    Scott E.Tomasa Rand, MD

## 2023-01-09 ENCOUNTER — Telehealth: Payer: Self-pay | Admitting: Gastroenterology

## 2023-01-09 ENCOUNTER — Other Ambulatory Visit: Payer: Self-pay

## 2023-01-09 MED ORDER — NA SULFATE-K SULFATE-MG SULF 17.5-3.13-1.6 GM/177ML PO SOLN: 1.0000 | Freq: Once | ORAL | 0 refills | Status: AC

## 2023-01-09 NOTE — Telephone Encounter (Signed)
Refill sent to patients pharmacy. 

## 2023-01-09 NOTE — Telephone Encounter (Signed)
Patient called stating she has not received prep medication for colonoscopy on 6/11. Requesting for prescription to be call in [facility] to [facility] [address]. Please advise, thank you.

## 2023-01-22 ENCOUNTER — Encounter: Payer: PRIVATE HEALTH INSURANCE | Admitting: Gastroenterology

## 2023-02-26 ENCOUNTER — Encounter: Payer: PRIVATE HEALTH INSURANCE | Admitting: Gastroenterology

## 2023-04-05 ENCOUNTER — Encounter: Payer: PRIVATE HEALTH INSURANCE | Admitting: Gastroenterology

## 2023-05-15 ENCOUNTER — Telehealth: Payer: Self-pay | Admitting: Gastroenterology

## 2023-05-15 ENCOUNTER — Other Ambulatory Visit: Payer: Self-pay

## 2023-05-15 MED ORDER — NA SULFATE-K SULFATE-MG SULF 17.5-3.13-1.6 GM/177ML PO SOLN: 1.0000 | Freq: Once | ORAL | 0 refills | Status: AC

## 2023-05-15 NOTE — Telephone Encounter (Signed)
Suprep sent to patients pharmacy

## 2023-05-15 NOTE — Telephone Encounter (Signed)
PT is calling to have Suprep sent to [facility] [address]. Please advise.

## 2023-05-22 ENCOUNTER — Encounter: Payer: Self-pay | Admitting: Gastroenterology

## 2023-05-22 ENCOUNTER — Ambulatory Visit: Payer: PRIVATE HEALTH INSURANCE | Admitting: Gastroenterology

## 2023-05-22 VITALS — BP 112/72 | HR 71 | Temp 98.9°F | Resp 14 | Ht 64.0 in | Wt 136.0 lb

## 2023-05-22 DIAGNOSIS — Z1211 Encounter for screening for malignant neoplasm of colon: Secondary | ICD-10-CM | POA: Diagnosis present

## 2023-05-22 DIAGNOSIS — Z83719 Family history of colon polyps, unspecified: Secondary | ICD-10-CM | POA: Diagnosis not present

## 2023-05-22 DIAGNOSIS — K635 Polyp of colon: Secondary | ICD-10-CM | POA: Diagnosis not present

## 2023-05-22 DIAGNOSIS — D122 Benign neoplasm of ascending colon: Secondary | ICD-10-CM | POA: Diagnosis not present

## 2023-05-22 MED ORDER — SODIUM CHLORIDE 0.9 % IV SOLN: 500.0000 mL | Freq: Once | INTRAVENOUS | Status: DC

## 2023-05-22 NOTE — Progress Notes (Signed)
Called to room to assist during endoscopic procedure.  Patient ID and intended procedure confirmed with present staff. Received instructions for my participation in the procedure from the performing physician.  

## 2023-05-22 NOTE — Progress Notes (Signed)
Gastroenterology History and Physical   Primary Care Physician:  Creola Corn, MD   Reason for Procedure:   Colon cancer screening, increased risk  Plan:    Colonoscopy     HPI: Latasha Robertson is a 47 y.o. female undergoing screening colonoscopy.  She has no family history of colon cancer but her sister had an advanced colon polyp.  She has mild chronic lower abdominal pain but no other chronic GI symptoms.    Past Medical History:  Diagnosis Date   Hypertension     Past Surgical History:  Procedure Laterality Date   NO PAST SURGERIES      Prior to Admission medications   Not on File    No current outpatient medications on file.   Current Facility-Administered Medications  Medication Dose Route Frequency Provider Last Rate Last Admin   0.9 %  sodium chloride infusion  500 mL Intravenous Once Jenel Lucks, MD        Allergies as of 05/22/2023 - Review Complete 05/22/2023  Allergen Reaction Noted   Tramadol Nausea And Vomiting 08/12/2014   Vicodin [hydrocodone-acetaminophen] Nausea And Vomiting 08/12/2014    Family History  Problem Relation Age of Onset   Colon polyps Mother    Colon polyps Father    Prostate cancer Father    Colon polyps Sister    Alcohol abuse Neg Hx    Asthma Neg Hx    Arthritis Neg Hx    Birth defects Neg Hx    Cancer Neg Hx    COPD Neg Hx    Depression Neg Hx    Diabetes Neg Hx    Drug abuse Neg Hx    Early death Neg Hx    Hearing loss Neg Hx    Heart disease Neg Hx    Hyperlipidemia Neg Hx    Hypertension Neg Hx    Kidney disease Neg Hx    Learning disabilities Neg Hx    Mental illness Neg Hx    Mental retardation Neg Hx    Miscarriages / Stillbirths Neg Hx    Stroke Neg Hx    Vision loss Neg Hx    Varicose Veins Neg Hx    Colon cancer Neg Hx    Stomach cancer Neg Hx     Social History   Socioeconomic History   Marital status: Married    Spouse name: Not on file   Number of children: 2   Years of  education: Not on file   Highest education level: Not on file  Occupational History   Not on file  Tobacco Use   Smoking status: Never   Smokeless tobacco: Not on file  Substance and Sexual Activity   Alcohol use: No   Drug use: No   Sexual activity: Yes    Birth control/protection: None  Other Topics Concern   Not on file  Social History Narrative   Not on file   Social Determinants of Health   Financial Resource Strain: Not on file  Food Insecurity: Not on file  Transportation Needs: Not on file  Physical Activity: Not on file  Stress: Not on file  Social Connections: Not on file  Intimate Partner Violence: Not on file    Review of Systems:  All other review of systems negative except as mentioned in the HPI.  Physical Exam: Vital signs BP (!) 133/91   Pulse 82   Temp 98.9 F (37.2 C)   Ht 5\' 4"  (1.626 m)  Wt 136 lb (61.7 kg)   LMP 05/09/2023 (Exact Date)   SpO2 98%   BMI 23.34 kg/m   General:   Alert,  Well-developed, well-nourished, pleasant and cooperative in NAD Airway:  Mallampati 1 Lungs:  Clear throughout to auscultation.   Heart:  Regular rate and rhythm; no murmurs, clicks, rubs,  or gallops. Abdomen:  Soft, nontender and nondistended. Normal bowel sounds.   Neuro/Psych:  Normal mood and affect. A and O x 3   Courteny Egler E. Tomasa Rand, MD [facility]enter[facility]

## 2023-05-22 NOTE — Progress Notes (Signed)
Report to PACU, RN, vss, BBS= Clear.  

## 2023-05-22 NOTE — Op Note (Signed)
[facility] Patient Name: Latasha Robertson Procedure Date: 05/22/2023 12:12 PM MRN: [id number] Endoscopist: Lorin Picket E. Tomasa Rand , MD, [id number] Age: 47 Referring MD:  Date of Birth: [birth year] Gender: Female Account #: [id number] Procedure:                Colonoscopy Indications:              Colon cancer screening in patient with 1st-degree                            relative having advanced adenoma of the colon                            before age 58 Medicines:                Monitored Anesthesia Care Procedure:                Pre-Anesthesia Assessment:                           - Prior to the procedure, a History and Physical                            was performed, and patient medications, allergies                            and sensitivities were reviewed. The patient's                            tolerance of previous anesthesia was reviewed.                           After obtaining informed consent, the colonoscope                            was passed under direct vision. Throughout the                            procedure, the patient's blood pressure, pulse, and                            oxygen saturations were monitored continuously. The                            Olympus PCF-H190DL (#[id number]) Colonoscope was                            introduced through the anus and advanced to the the                            terminal ileum, with identification of the                            appendiceal orifice and IC valve. The colonoscopy  was performed without difficulty. The patient                            tolerated the procedure well. The quality of the                            bowel preparation was adequate. The terminal ileum,                            ileocecal valve, appendiceal orifice, and rectum                            were photographed. The bowel preparation used was                            SUPREP via split dose  instruction. Scope In: 12:18:22 PM Scope Out: 12:38:38 PM Scope Withdrawal Time: 0 hours 17 minutes 11 seconds  Total Procedure Duration: 0 hours 20 minutes 16 seconds  Findings:                 The perianal and digital rectal examinations were                            normal. Pertinent negatives include normal                            sphincter tone and no palpable rectal lesions.                           A 6 mm polyp was found in the ascending colon. The                            polyp was sessile. The polyp was removed with a                            cold snare. Resection and retrieval were complete.                            Estimated blood loss was minimal.                           The exam was otherwise normal throughout the                            examined colon.                           The terminal ileum appeared normal.                           The retroflexed view of the distal rectum and anal                            verge was normal and showed no anal or rectal  abnormalities. Complications:            No immediate complications. Estimated Blood Loss:     Estimated blood loss was minimal. Impression:               - One 6 mm polyp in the ascending colon, removed                            with a cold snare. Resected and retrieved.                           - The examined portion of the ileum was normal.                           - The distal rectum and anal verge are normal on                            retroflexion view. Recommendation:           - Patient has a contact number available for                            emergencies. The signs and symptoms of potential                            delayed complications were discussed with the                            patient. Return to normal activities tomorrow.                            Written discharge instructions were provided to the                            patient.                            - Resume previous diet.                           - Continue present medications.                           - Await pathology results.                           - Repeat colonoscopy (date not yet determined) for                            surveillance based on pathology results. Glendola Friedhoff E. Tomasa Rand, MD 05/22/2023 12:42:47 PM This report has been signed electronically.

## 2023-05-22 NOTE — Patient Instructions (Signed)

## 2023-05-23 ENCOUNTER — Telehealth: Payer: Self-pay | Admitting: *Deleted

## 2023-05-23 NOTE — Telephone Encounter (Signed)
  Follow up Call-     05/22/2023   11:15 AM  Call back number  Post procedure Call Back phone  # [phone]  Permission to leave phone message Yes     Patient questions:  Do you have a fever, pain , or abdominal swelling? No. Pain Score  0 *  Have you tolerated food without any problems? Yes.    Have you been able to return to your normal activities? Yes.    Do you have any questions about your discharge instructions: Diet   No. Medications  No. Follow up visit  No.  Do you have questions or concerns about your Care? No.  Actions: * If pain score is 4 or above: No action needed, pain <4.

## 2023-05-24 LAB — SURGICAL PATHOLOGY

## 2023-05-24 NOTE — Progress Notes (Signed)
Latasha Robertson, The polyp which I removed during your recent procedure was proven to be completely benign but is considered a "pre-cancerous" polyp that MAY have grown into cancer if it had not been removed.  Studies shows that at least 20% of women over age 44 and 30% of men over age 16 have pre-cancerous polyps.  Based on current nationally recognized surveillance guidelines, I recommend that you have a repeat colonoscopy in 7 years.   If you develop any new rectal bleeding, abdominal pain or significant bowel habit changes, please contact me before then.
